# Patient Record
Sex: Female | Born: 1965 | Race: White | Hispanic: No | Marital: Single | Smoking: Never smoker
Health system: Southern US, Community
[De-identification: ages and names within clinical notes are randomized; demographics above are authoritative.]

## PROBLEM LIST (undated history)

## (undated) DIAGNOSIS — C801 Malignant (primary) neoplasm, unspecified: Secondary | ICD-10-CM

## (undated) DIAGNOSIS — F419 Anxiety disorder, unspecified: Secondary | ICD-10-CM

## (undated) DIAGNOSIS — R112 Nausea with vomiting, unspecified: Secondary | ICD-10-CM

## (undated) DIAGNOSIS — J309 Allergic rhinitis, unspecified: Secondary | ICD-10-CM

## (undated) DIAGNOSIS — T4145XA Adverse effect of unspecified anesthetic, initial encounter: Secondary | ICD-10-CM

## (undated) DIAGNOSIS — T8859XA Other complications of anesthesia, initial encounter: Secondary | ICD-10-CM

## (undated) DIAGNOSIS — Z9889 Other specified postprocedural states: Secondary | ICD-10-CM

## (undated) DIAGNOSIS — J329 Chronic sinusitis, unspecified: Secondary | ICD-10-CM

## (undated) HISTORY — PX: ABDOMINAL HYSTERECTOMY: SHX81

## (undated) HISTORY — PX: OTHER SURGICAL HISTORY: SHX169

---
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
## 2013-05-01 HISTORY — PX: OTHER SURGICAL HISTORY: SHX169

## 2017-12-21 ENCOUNTER — Other Ambulatory Visit: Payer: Self-pay | Admitting: Physician Assistant

## 2017-12-21 DIAGNOSIS — Z1231 Encounter for screening mammogram for malignant neoplasm of breast: Secondary | ICD-10-CM

## 2018-01-16 ENCOUNTER — Ambulatory Visit
Admission: RE | Admit: 2018-01-16 | Discharge: 2018-01-16 | Disposition: A | Discharge status: Home / Self Care | Payer: BC Managed Care – PPO | Source: Ambulatory Visit | Attending: Physician Assistant | Admitting: Physician Assistant

## 2018-01-16 DIAGNOSIS — Z1231 Encounter for screening mammogram for malignant neoplasm of breast: Secondary | ICD-10-CM

## 2019-01-01 ENCOUNTER — Other Ambulatory Visit: Payer: Self-pay | Admitting: Orthopaedic Surgery

## 2019-01-16 NOTE — Pre-Procedure Instructions (Signed)
[id number] - [place], [address] [place] [address] Phone: [phone] Fax: [phone]      Your procedure is scheduled on  01-21-19 from 0930-110AM  Report to [facility] Main Entrance "A" at 0730 A.M., and check in at the Admitting office.  Call this number if you have problems the morning of surgery:  [phone]  Call [phone] if you have any questions prior to your surgery date Monday-Friday 8am-4pm    Remember:  Do not eat  after midnight the night before your surgery  You may drink clear liquids until 0630AM the morning of your surgery.   Clear liquids allowed are: Water, Non-Citrus Juices (without pulp), Carbonated Beverages, Clear Tea, Black Coffee Only, and Gatorade  Please complete your PRE-SURGERY ENSURE that was provided to you by 0630AM the morning of surgery.  Please, if able, drink it in one setting. DO NOT SIP.   Take these medicines the morning of surgery with A SIP OF WATER : fluticasone (FLONASE) as needed ALPRAZolam Duanne Moron) as needed  escitalopram (LEXAPRO) rosuvastatin (CRESTOR)  7 days prior to surgery STOP taking any Aspirin (unless otherwise instructed by your surgeon), Aleve, Naproxen, Ibuprofen, Motrin, Advil, Goody's, BC's, all herbal medications, fish oil, and all vitamins.    The Morning of Surgery  Do not wear jewelry, make-up or nail polish.  Do not wear lotions, powders, or perfumes/colognes, or deodorant  Do not shave 48 hours prior to surgery.    Do not bring valuables to the hospital.  [facility] is not responsible for any belongings or valuables.  If you are a smoker, DO NOT Smoke 24 hours prior to surgery IF you wear a CPAP at night please bring your mask, tubing, and machine the morning of surgery   Remember that you must have someone to transport you home after your surgery, and remain with you for 24 hours if you are discharged the same day.   Contacts, glasses, hearing aids,  dentures or bridgework may not be worn into surgery.    Leave your suitcase in the car.  After surgery it may be brought to your room.  For patients admitted to the hospital, discharge time will be determined by your treatment team.  Patients discharged the day of surgery will not be allowed to drive home.    Special instructions:   [place]- Preparing For Surgery  Before surgery, you can play an important role. Because skin is not sterile, your skin needs to be as free of germs as possible. You can reduce the number of germs on your skin by washing with CHG (chlorahexidine gluconate) Soap before surgery.  CHG is an antiseptic cleaner which kills germs and bonds with the skin to continue killing germs even after washing.    Oral Hygiene is also important to reduce your risk of infection.  Remember - BRUSH YOUR TEETH THE MORNING OF SURGERY WITH YOUR REGULAR TOOTHPASTE  Please do not use if you have an allergy to CHG or antibacterial soaps. If your skin becomes reddened/irritated stop using the CHG.  Do not shave (including legs and underarms) for at least 48 hours prior to first CHG shower. It is OK to shave your face.  Please follow these instructions carefully.   1. Shower the NIGHT BEFORE SURGERY and the MORNING OF SURGERY with CHG Soap.   2. If you chose to wash your hair, wash your hair first as usual with your normal shampoo.  3. After you  shampoo, rinse your hair and body thoroughly to remove the shampoo.  4. Use CHG as you would any other liquid soap. You can apply CHG directly to the skin and wash gently with a scrungie or a clean washcloth.   5. Apply the CHG Soap to your body ONLY FROM THE NECK DOWN.  Do not use on open wounds or open sores. Avoid contact with your eyes, ears, mouth and genitals (private parts). Wash Face and genitals (private parts)  with your normal soap.   6. Wash thoroughly, paying special attention to the area where your surgery will be  performed.  7. Thoroughly rinse your body with warm water from the neck down.  8. DO NOT shower/wash with your normal soap after using and rinsing off the CHG Soap.  9. Pat yourself dry with a CLEAN TOWEL.  10. Wear CLEAN PAJAMAS to bed the night before surgery, wear comfortable clothes the morning of surgery  11. Place CLEAN SHEETS on your bed the night of your first shower and DO NOT SLEEP WITH PETS.    Day of Surgery:  Do not apply any deodorants/lotions. Please shower the morning of surgery with the CHG soap  Please wear clean clothes to the hospital/surgery center.   Remember to brush your teeth WITH YOUR REGULAR TOOTHPASTE.   Please read over the  fact sheets that you were given.

## 2019-01-17 ENCOUNTER — Encounter (HOSPITAL_COMMUNITY)
Admission: RE | Admit: 2019-01-17 | Discharge: 2019-01-17 | Disposition: A | Discharge status: Home / Self Care | Payer: BC Managed Care – PPO | Source: Ambulatory Visit | Attending: Orthopaedic Surgery | Admitting: Orthopaedic Surgery

## 2019-01-17 ENCOUNTER — Other Ambulatory Visit (HOSPITAL_COMMUNITY)
Admission: RE | Admit: 2019-01-17 | Discharge: 2019-01-17 | Disposition: A | Discharge status: Home / Self Care | Payer: BC Managed Care – PPO | Source: Ambulatory Visit | Attending: Orthopaedic Surgery | Admitting: Orthopaedic Surgery

## 2019-01-17 ENCOUNTER — Other Ambulatory Visit: Payer: Self-pay

## 2019-01-17 ENCOUNTER — Encounter (HOSPITAL_COMMUNITY): Payer: Self-pay

## 2019-01-17 DIAGNOSIS — Z01812 Encounter for preprocedural laboratory examination: Secondary | ICD-10-CM | POA: Diagnosis present

## 2019-01-17 HISTORY — DX: Chronic sinusitis, unspecified: J32.9

## 2019-01-17 HISTORY — DX: Allergic rhinitis, unspecified: J30.9

## 2019-01-17 HISTORY — DX: Other specified postprocedural states: R11.2

## 2019-01-17 HISTORY — DX: Anxiety disorder, unspecified: F41.9

## 2019-01-17 HISTORY — DX: Other complications of anesthesia, initial encounter: T88.59XA

## 2019-01-17 HISTORY — DX: Other specified postprocedural states: Z98.890

## 2019-01-17 HISTORY — DX: Malignant (primary) neoplasm, unspecified: C80.1

## 2019-01-17 LAB — CBC
HCT: 42.9 % (ref 36.0–46.0)
Hemoglobin: 14 g/dL (ref 12.0–15.0)
MCH: 29.9 pg (ref 26.0–34.0)
MCHC: 32.6 g/dL (ref 30.0–36.0)
MCV: 91.5 fL (ref 80.0–100.0)
Platelets: 230 10*3/uL (ref 150–400)
RBC: 4.69 MIL/uL (ref 3.87–5.11)
RDW: 13.1 % (ref 11.5–15.5)
WBC: 4.3 10*3/uL (ref 4.0–10.5)
nRBC: 0 % (ref 0.0–0.2)

## 2019-01-17 NOTE — Progress Notes (Signed)
PCP: Julian Hy, PA--[facility] Cardiologist: Denies  EKG: n/a CXR: n/a ECHO: denies Stress Test: denies Cardiac Cath: denies  Pre-surgery Ensure drink provided  Patient denies shortness of breath, fever, cough, and chest pain at PAT appointment.  Patient verbalized understanding of instructions provided today at the PAT appointment.  Patient asked to review instructions at home and day of surgery.

## 2019-01-17 NOTE — Pre-Procedure Instructions (Signed)
[id number] - [place], [place] [address] Phone: [phone] Fax: [phone]      Your procedure is scheduled on  January 21, 2019  Report to [facility] Main Entrance "A" at 07:30 A.M., and check in at the Admitting office.  Call this number if you have problems the morning of surgery:  [phone]  Call [phone] if you have any questions prior to your surgery date Monday-Friday 8am-4pm   Remember:  Do not eat  after midnight the night before your surgery  You may drink clear liquids until 06:30AM the morning of your surgery.   Clear liquids allowed are: Water, Non-Citrus Juices (without pulp), Carbonated Beverages, Clear Tea, Black Coffee Only, and Gatorade  Please complete your PRE-SURGERY ENSURE that was provided to you by 06:30AM the morning of surgery.  Please, if able, drink it in one setting. DO NOT SIP.   Take these medicines the morning of surgery with A SIP OF WATER : fluticasone (FLONASE) as needed ALPRAZolam Duanne Moron) as needed Eye drops if needed Sodium Chloride (Ocean) nasal spray if needed  escitalopram (LEXAPRO) rosuvastatin (CRESTOR)  7 days prior to surgery STOP taking any Aspirin (unless otherwise instructed by your surgeon), Aleve, Naproxen, Ibuprofen, Motrin, Advil, Goody's, BC's, all herbal medications, fish oil, and all vitamins.    The Morning of Surgery  Do not wear jewelry, make-up or nail polish.  Do not wear lotions, powders, or perfumes/colognes, or deodorant  Do not shave 48 hours prior to surgery.    Do not bring valuables to the hospital.  [facility] is not responsible for any belongings or valuables.  If you are a smoker, DO NOT Smoke 24 hours prior to surgery IF you wear a CPAP at night please bring your mask, tubing, and machine the morning of surgery   Remember that you must have someone to transport you home after your surgery, andremain with you for 24 hours if you are  discharged the same day.  Contacts, glasses, hearing aids, dentures or bridgework may not be worn into surgery.   Leave your suitcase in the car.  After surgery it may be brought to your room.  For patients admitted to the hospital, discharge time will be determined by your treatment team.  Patients discharged the day of surgery will not be allowed to drive home.    Special instructions:   [place]- Preparing For Surgery  Before surgery, you can play an important role. Because skin is not sterile, your skin needs to be as free of germs as possible. You can reduce the number of germs on your skin by washing with CHG (chlorahexidine gluconate) Soap before surgery.  CHG is an antiseptic cleaner which kills germs and bonds with the skin to continue killing germs even after washing.    Oral Hygiene is also important to reduce your risk of infection.  Remember - BRUSH YOUR TEETH THE MORNING OF SURGERY WITH YOUR REGULAR TOOTHPASTE  Please do not use if you have an allergy to CHG or antibacterial soaps. If your skin becomes reddened/irritated stop using the CHG.  Do not shave (including legs and underarms) for at least 48 hours prior to first CHG shower. It is OK to shave your face.  Please follow these instructions carefully.   1. Shower the NIGHT BEFORE SURGERY and the MORNING OF SURGERY with CHG Soap.   2. If you chose to wash your hair, wash your hair first as usual with  your normal shampoo.  3. After you shampoo, rinse your hair and body thoroughly to remove the shampoo.  4. Use CHG as you would any other liquid soap. You can apply CHG directly to the skin and wash gently with a scrungie or a clean washcloth.   5. Apply the CHG Soap to your body ONLY FROM THE NECK DOWN.  Do not use on open wounds or open sores. Avoid contact with your eyes, ears, mouth and genitals (private parts). Wash Face and genitals (private parts)  with your normal soap.   6. Wash thoroughly, paying special  attention to the area where your surgery will be performed.  7. Thoroughly rinse your body with warm water from the neck down.  8. DO NOT shower/wash with your normal soap after using and rinsing off the CHG Soap.  9. Pat yourself dry with a CLEAN TOWEL.  10. Wear CLEAN PAJAMAS to bed the night before surgery, wear comfortable clothes the morning of surgery  11. Place CLEAN SHEETS on your bed the night of your first shower and DO NOT SLEEP WITH PETS.    Day of Surgery:  Do not apply any deodorants/lotions. Please shower the morning of surgery with the CHG soap  Please wear clean clothes to the hospital/surgery center.   Remember to brush your teeth WITH YOUR REGULAR TOOTHPASTE.   Please read over the  fact sheets that you were given.

## 2019-01-18 LAB — NOVEL CORONAVIRUS, NAA (HOSP ORDER, SEND-OUT TO REF LAB; TAT 18-24 HRS): SARS-CoV-2, NAA: NOT DETECTED

## 2019-01-20 NOTE — Anesthesia Preprocedure Evaluation (Addendum)
Anesthesia Evaluation  Patient identified by MRN, date of birth, ID band Patient awake    Reviewed: Allergy & Precautions, NPO status , Patient's Chart, lab work & pertinent test results  History of Anesthesia Complications (+) PONV and history of anesthetic complications  Airway Mallampati: II  TM Distance: >3 FB Neck ROM: Full    Dental no notable dental hx.    Pulmonary neg pulmonary ROS,    Pulmonary exam normal        Cardiovascular negative cardio ROS Normal cardiovascular exam     Neuro/Psych Anxiety negative neurological ROS  negative psych ROS   GI/Hepatic negative GI ROS, Neg liver ROS,   Endo/Other  negative endocrine ROS  Renal/GU negative Renal ROS  negative genitourinary   Musculoskeletal negative musculoskeletal ROS (+)   Abdominal   Peds  Hematology negative hematology ROS (+)   Anesthesia Other Findings Day of surgery medications reviewed with patient.  Reproductive/Obstetrics negative OB ROS                            Anesthesia Physical Anesthesia Plan  ASA: II  Anesthesia Plan: Regional   Post-op Pain Management:    Induction:   PONV Risk Score and Plan: 3 and Treatment may vary due to age or medical condition, Propofol infusion, Ondansetron and Midazolam  Airway Management Planned: Natural Airway and Simple Face Mask  Additional Equipment: None  Intra-op Plan:   Post-operative Plan:   Informed Consent: I have reviewed the patients History and Physical, chart, labs and discussed the procedure including the risks, benefits and alternatives for the proposed anesthesia with the patient or authorized representative who has indicated his/her understanding and acceptance.     Dental advisory given  Plan Discussed with: CRNA  Anesthesia Plan Comments:        Anesthesia Quick Evaluation

## 2019-01-21 ENCOUNTER — Ambulatory Visit (HOSPITAL_COMMUNITY): Payer: BC Managed Care – PPO | Admitting: Anesthesiology

## 2019-01-21 ENCOUNTER — Encounter (HOSPITAL_COMMUNITY)
Admission: RE | Disposition: A | Discharge status: Home / Self Care | Payer: Self-pay | Source: Home / Self Care | Attending: Orthopaedic Surgery

## 2019-01-21 ENCOUNTER — Ambulatory Visit (HOSPITAL_COMMUNITY)
Admission: RE | Admit: 2019-01-21 | Discharge: 2019-01-21 | Disposition: A | Discharge status: Home / Self Care | Payer: BC Managed Care – PPO | Attending: Orthopaedic Surgery | Admitting: Orthopaedic Surgery

## 2019-01-21 ENCOUNTER — Encounter (HOSPITAL_COMMUNITY): Payer: Self-pay

## 2019-01-21 ENCOUNTER — Other Ambulatory Visit: Payer: Self-pay

## 2019-01-21 DIAGNOSIS — J309 Allergic rhinitis, unspecified: Secondary | ICD-10-CM | POA: Insufficient documentation

## 2019-01-21 DIAGNOSIS — Z85828 Personal history of other malignant neoplasm of skin: Secondary | ICD-10-CM | POA: Insufficient documentation

## 2019-01-21 DIAGNOSIS — F419 Anxiety disorder, unspecified: Secondary | ICD-10-CM | POA: Diagnosis not present

## 2019-01-21 DIAGNOSIS — L7612 Accidental puncture and laceration of skin and subcutaneous tissue during other procedure: Secondary | ICD-10-CM | POA: Insufficient documentation

## 2019-01-21 DIAGNOSIS — Z79890 Hormone replacement therapy (postmenopausal): Secondary | ICD-10-CM | POA: Insufficient documentation

## 2019-01-21 DIAGNOSIS — Y658 Other specified misadventures during surgical and medical care: Secondary | ICD-10-CM | POA: Diagnosis not present

## 2019-01-21 DIAGNOSIS — Z79899 Other long term (current) drug therapy: Secondary | ICD-10-CM | POA: Insufficient documentation

## 2019-01-21 DIAGNOSIS — M720 Palmar fascial fibromatosis [Dupuytren]: Secondary | ICD-10-CM | POA: Diagnosis not present

## 2019-01-21 DIAGNOSIS — R2242 Localized swelling, mass and lump, left lower limb: Secondary | ICD-10-CM | POA: Diagnosis present

## 2019-01-21 HISTORY — PX: MASS EXCISION: SHX2000

## 2019-01-21 SURGERY — EXCISION MASS
Anesthesia: Regional | Site: Foot | Laterality: Left

## 2019-01-21 MED ORDER — PROMETHAZINE HCL 25 MG/ML IJ SOLN: 6.2500 mg | INTRAMUSCULAR | Status: DC | PRN

## 2019-01-21 MED ORDER — FENTANYL CITRATE (PF) 100 MCG/2ML IJ SOLN: 25.0000 ug | INTRAMUSCULAR | Status: DC | PRN

## 2019-01-21 MED ORDER — FENTANYL CITRATE (PF) 100 MCG/2ML IJ SOLN
INTRAMUSCULAR | Status: AC
  Administered 2019-01-21: 100 ug via INTRAVENOUS
  Filled 2019-01-21: qty 2

## 2019-01-21 MED ORDER — MIDAZOLAM HCL 2 MG/2ML IJ SOLN
1.0000 mg | Freq: Once | INTRAMUSCULAR | Status: AC
  Administered 2019-01-21: 09:00:00 1 mg via INTRAVENOUS

## 2019-01-21 MED ORDER — OXYCODONE HCL 5 MG PO TABS: 5.0000 mg | ORAL_TABLET | Freq: Once | ORAL | Status: DC | PRN

## 2019-01-21 MED ORDER — ACETAMINOPHEN 500 MG PO TABS
1000.0000 mg | ORAL_TABLET | Freq: Once | ORAL | Status: AC
  Administered 2019-01-21: 1000 mg via ORAL
  Filled 2019-01-21: qty 2

## 2019-01-21 MED ORDER — LACTATED RINGERS IV SOLN
INTRAVENOUS | Status: DC
  Administered 2019-01-21: 08:00:00 via INTRAVENOUS

## 2019-01-21 MED ORDER — POVIDONE-IODINE 10 % EX SWAB
2.0000 "application " | Freq: Once | CUTANEOUS | Status: AC
  Administered 2019-01-21: 2 via TOPICAL

## 2019-01-21 MED ORDER — OXYCODONE HCL 5 MG/5ML PO SOLN: 5.0000 mg | Freq: Once | ORAL | Status: DC | PRN

## 2019-01-21 MED ORDER — MIDAZOLAM HCL 2 MG/2ML IJ SOLN
INTRAMUSCULAR | Status: AC
  Administered 2019-01-21: 09:00:00 1 mg via INTRAVENOUS
  Filled 2019-01-21: qty 2

## 2019-01-21 MED ORDER — BUPIVACAINE HCL (PF) 0.5 % IJ SOLN
INTRAMUSCULAR | Status: DC | PRN
  Administered 2019-01-21: 30 mL via PERINEURAL

## 2019-01-21 MED ORDER — CLINDAMYCIN PHOSPHATE 900 MG/50ML IV SOLN
900.0000 mg | Freq: Once | INTRAVENOUS | Status: AC
  Administered 2019-01-21: 900 mg via INTRAVENOUS
  Filled 2019-01-21: qty 50

## 2019-01-21 MED ORDER — PROPOFOL 10 MG/ML IV BOLUS
INTRAVENOUS | Status: DC | PRN
  Administered 2019-01-21: 20 mg via INTRAVENOUS

## 2019-01-21 MED ORDER — ONDANSETRON HCL 4 MG/2ML IJ SOLN
INTRAMUSCULAR | Status: DC | PRN
  Administered 2019-01-21: 4 mg via INTRAVENOUS

## 2019-01-21 MED ORDER — PROPOFOL 500 MG/50ML IV EMUL
INTRAVENOUS | Status: DC | PRN
  Administered 2019-01-21: 75 ug/kg/min via INTRAVENOUS

## 2019-01-21 MED ORDER — OXYCODONE HCL 5 MG PO TABS: 5.0000 mg | ORAL_TABLET | ORAL | 0 refills | Status: AC | PRN

## 2019-01-21 MED ORDER — 0.9 % SODIUM CHLORIDE (POUR BTL) OPTIME
TOPICAL | Status: DC | PRN
  Administered 2019-01-21: 1000 mL

## 2019-01-21 MED ORDER — LIDOCAINE 2% (20 MG/ML) 5 ML SYRINGE
INTRAMUSCULAR | Status: DC | PRN
  Administered 2019-01-21: 40 mg via INTRAVENOUS

## 2019-01-21 MED ORDER — ONDANSETRON HCL 4 MG/2ML IJ SOLN
INTRAMUSCULAR | Status: AC
  Filled 2019-01-21: qty 2

## 2019-01-21 MED ORDER — LIDOCAINE 2% (20 MG/ML) 5 ML SYRINGE
INTRAMUSCULAR | Status: AC
  Filled 2019-01-21: qty 5

## 2019-01-21 MED ORDER — FENTANYL CITRATE (PF) 100 MCG/2ML IJ SOLN
100.0000 ug | Freq: Once | INTRAMUSCULAR | Status: AC
  Administered 2019-01-21: 09:00:00 100 ug via INTRAVENOUS

## 2019-01-21 SURGICAL SUPPLY — 42 items
BNDG COHESIVE 4X5 TAN STRL (GAUZE/BANDAGES/DRESSINGS) IMPLANT
BNDG ELASTIC 4X5.8 VLCR STR LF (GAUZE/BANDAGES/DRESSINGS) ×2 IMPLANT
BNDG ELASTIC 6X10 VLCR STRL LF (GAUZE/BANDAGES/DRESSINGS) ×2 IMPLANT
BNDG ESMARK 4X9 LF (GAUZE/BANDAGES/DRESSINGS) ×2 IMPLANT
BNDG GAUZE ELAST 4 BULKY (GAUZE/BANDAGES/DRESSINGS) ×2 IMPLANT
CHLORAPREP W/TINT 26 (MISCELLANEOUS) ×4 IMPLANT
COVER SURGICAL LIGHT HANDLE (MISCELLANEOUS) ×4 IMPLANT
COVER WAND RF STERILE (DRAPES) IMPLANT
DRAPE U-SHAPE 47X51 STRL (DRAPES) ×2 IMPLANT
DRSG PAD ABDOMINAL 8X10 ST (GAUZE/BANDAGES/DRESSINGS) ×2 IMPLANT
DRSG XEROFORM 1X8 (GAUZE/BANDAGES/DRESSINGS) ×2 IMPLANT
ELECT REM PT RETURN 9FT ADLT (ELECTROSURGICAL) ×2
ELECTRODE REM PT RTRN 9FT ADLT (ELECTROSURGICAL) ×1 IMPLANT
GAUZE SPONGE 4X4 12PLY STRL (GAUZE/BANDAGES/DRESSINGS) ×2 IMPLANT
GAUZE SPONGE 4X4 12PLY STRL LF (GAUZE/BANDAGES/DRESSINGS) ×2 IMPLANT
GAUZE XEROFORM 1X8 LF (GAUZE/BANDAGES/DRESSINGS) ×2 IMPLANT
GLOVE BIOGEL M STRL SZ7.5 (GLOVE) ×2 IMPLANT
GLOVE INDICATOR 8.0 STRL GRN (GLOVE) ×2 IMPLANT
GOWN STRL REUS W/ TWL LRG LVL3 (GOWN DISPOSABLE) ×1 IMPLANT
GOWN STRL REUS W/ TWL XL LVL3 (GOWN DISPOSABLE) ×1 IMPLANT
GOWN STRL REUS W/TWL LRG LVL3 (GOWN DISPOSABLE) ×1
GOWN STRL REUS W/TWL XL LVL3 (GOWN DISPOSABLE) ×1
HANDPIECE INTERPULSE COAX TIP (DISPOSABLE) ×1
KIT BASIN OR (CUSTOM PROCEDURE TRAY) ×2 IMPLANT
KIT TURNOVER KIT B (KITS) ×2 IMPLANT
MANIFOLD NEPTUNE II (INSTRUMENTS) ×2 IMPLANT
NEEDLE 22X1 1/2 (OR ONLY) (NEEDLE) IMPLANT
NS IRRIG 1000ML POUR BTL (IV SOLUTION) ×2 IMPLANT
PACK ORTHO EXTREMITY (CUSTOM PROCEDURE TRAY) ×2 IMPLANT
PAD ARMBOARD 7.5X6 YLW CONV (MISCELLANEOUS) ×4 IMPLANT
PAD CAST 4YDX4 CTTN HI CHSV (CAST SUPPLIES) ×1 IMPLANT
PADDING CAST COTTON 4X4 STRL (CAST SUPPLIES) ×1
SET HNDPC FAN SPRY TIP SCT (DISPOSABLE) ×1 IMPLANT
SPONGE LAP 18X18 RF (DISPOSABLE) IMPLANT
STOCKINETTE IMPERVIOUS 9X36 MD (GAUZE/BANDAGES/DRESSINGS) IMPLANT
SUT ETHILON 2 0 FS 18 (SUTURE) IMPLANT
SUT ETHILON 3 0 PS 1 (SUTURE) ×2 IMPLANT
SUT MON AB 3-0 SH 27 (SUTURE) ×1
SUT MON AB 3-0 SH27 (SUTURE) ×1 IMPLANT
TUBE CONNECTING 12X1/4 (SUCTIONS) ×2 IMPLANT
UNDERPAD 30X30 (UNDERPADS AND DIAPERS) ×2 IMPLANT
YANKAUER SUCT BULB TIP NO VENT (SUCTIONS) ×2 IMPLANT

## 2019-01-21 NOTE — Op Note (Signed)
Diane Dillon female 53 y.o. 01/21/2019  PreOperative Diagnosis: Left foot plantar soft tissue mass  PostOperative Diagnosis: Same  PROCEDURE: Excision of left foot plantar soft tissue mass, deep  SURGEON: Melony Overly, MD  ASSISTANT: Holden Nevin, RNFA  ANESTHESIA: MAC with ankle block  FINDINGS: Plantar soft tissue mass adherent to the subcuticular tissue and within the plantar fascia  IMPLANTS: None  INDICATIONS:52 y.o. female had years of pain due to a plantar soft tissue mass.  She has a family history of Dupuytren's but does not have any hand deformities.  She has bilateral plantar soft tissue masses the left more symptomatic than the right.  She was seen in the office and failed conservative treatment the form of shoe modifications, activity modifications, orthotics and pain medication.  Given this she was indicated for excision of the soft tissue mass.  We discussed that the mass may return.  We also discussed the possibility of wound healing complications, infection, and demonstrating structures.  She wished to proceed with surgery after weighing these risk.  We also discussed the anesthetic and perioperative risk which include death.  Postoperatively she will allow to be weightbearing on the heel.  PROCEDURE: Patient was identified the preoperative holding area.  The left foot was marked by myself.  Consent was signed myself and the patient.  Anesthesia performed an ankle block.  She was taken the operative suite placed supine the operative table.  MAC anesthesia was induced without difficulty.  Bone foam was used on the left foot and perioperative antibiotics were given.  Left lower extremity was prepped and draped in the usual sterile fashion and a surgical timeout was performed.  A 4 inch Esmarch tourniquet was placed at the ankle.  We began by making a longitudinal incision on the medial plantar foot above the level of the glabrous skin.  This was taken sharply down through  skin and subcutaneous tissue.  The abductor hallucis longus tendon and muscle belly was identified and the fascia overlying this was incised.  Then an interval between the plantar aspect of the abductor pollicis muscle and the plantar soft tissue was created.  Then the medial cord of the plantar fascia was identified.  The plantar medial nerve was identified and protected.  Then a dorsal and plantar interval between the soft tissue mass that was within the plantar fascia was created.  The mass was excised sharply using a 15 blade, tenotomy scissors and blunt dissection.  The mass was quite large.  It was passed off and sent to pathology for evaluation.  During the mass excision process the mass was densely adhered to the plantar skin and a small 3 mm buttonhole through the plantar foot skin was created during excision.  This was unintentional.  After the mass was removed the deep area was inspected and palpated for further masses which there were none.  The wound was irrigated with normal saline.  The tourniquet was released.  Hemostasis was obtained through direct compression and Bovie cautery.  Then the deep tissue was closed with a 3-0 Monocryl stitch.  The skin was closed with 3-0 nylon.  The small buttonhole in the bottom of the foot was closed with 1 stitch of 2-0 nylon.  Then soft dressing in the form of Xeroform, 4 x 4's, sterile sheet cotton and Ace wrap was placed.  She was awakened from anesthesia and taken to recovery in stable condition.  POST OPERATIVE INSTRUCTIONS: Heel weightbearing to the left lower extremity Keep dressing in place  until follow-up Patient will follow-up in 2 weeks for dressing removal, wound check and suture removal if appropriate and transition to regular shoes once pain and swelling allows. We will follow-up on pathology  BLOOD LOSS:  Minimal         DRAINS: none         SPECIMEN: none       COMPLICATIONS: Small inadvertent buttonhole through the plantar skin  during mass excision which was closed uneventfully with a 3-0 nylon stitch         Disposition: PACU - hemodynamically stable.         Condition: stable

## 2019-01-21 NOTE — H&P (Signed)
Diane Dillon is an 53 y.o. female.   Chief Complaint: Left foot plantar soft tissue mass HPI: Diane Dillon is here today for surgical resection of her left foot mass.  She has had symptomatic left plantar foot mass for several years.  She is tried conservative treatment the form of shoe modifications, orthotics, activity modifications but continues have discomfort.  She denies that the masses grown significantly over the past several months.  She has a mass on the contralateral foot which is less symptomatic.  There is a family history of Dupuytren's disease within her relatives.  She denies any recent fevers or chills.  Denies any other lumps or bumps.  Past Medical History:  Diagnosis Date  . Allergic rhinitis   . Anxiety   . Cancer (HCC)    basal cell, forehead  . Chronic sinusitis   . Complication of anesthesia   . PONV (postoperative nausea and vomiting)     Past Surgical History:  Procedure Laterality Date  . ABDOMINAL HYSTERECTOMY    . CESAREAN SECTION    . mohs procedure     forehead  . SMR turbinates, FESS with maxillary balloon sinuplasty Bilateral 2015    Family History  Problem Relation Age of Onset  . Breast cancer Mother 42   Social History:  reports that she has never smoked. She has never used smokeless tobacco. She reports current alcohol use of about 1.0 standard drinks of alcohol per week. She reports that she does not use drugs.  Allergies:  Allergies  Allergen Reactions  . Cefaclor Hives  . Neosporin [Bacitracin-Polymyxin B] Itching    Blisters  . Sulfamethoxazole-Trimethoprim Rash    Medications Prior to Admission  Medication Sig Dispense Refill  . ALPRAZolam (XANAX) 0.25 MG tablet Take 0.25 mg by mouth daily as needed for anxiety.    Marland Kitchen amphetamine-dextroamphetamine (ADDERALL XR) 10 MG 24 hr capsule Take 10 mg by mouth daily.    . Ascorbic Acid (VITAMIN C) 1000 MG tablet Take 1,000 mg by mouth daily.    . Cholecalciferol (VITAMIN D3) 250 MCG (10000 UT)  capsule Take 10,000 Units by mouth daily.    . Cyanocobalamin (B-12) 5000 MCG CAPS Take 5,000 mcg by mouth daily.    Marland Kitchen escitalopram (LEXAPRO) 10 MG tablet Take 10 mg by mouth daily.    Marland Kitchen estradiol (ESTRACE) 1 MG tablet Take 1 mg by mouth daily.    . fluticasone (FLONASE) 50 MCG/ACT nasal spray Place 2 sprays into both nostrils daily as needed for allergies or rhinitis.    Marland Kitchen ibuprofen (ADVIL) 200 MG tablet Take 400-800 mg by mouth every 6 (six) hours as needed.    Vladimir Faster Glycol-Propyl Glycol (SYSTANE OP) Place 1 drop into both eyes 2 (two) times daily as needed (dry eyes).    . rosuvastatin (CRESTOR) 10 MG tablet Take 10 mg by mouth daily.    . sodium chloride (OCEAN) 0.65 % SOLN nasal spray Place 1 spray into both nostrils as needed for congestion.      No results found for this or any previous visit (from the past 48 hour(s)). No results found.  Review of Systems  Constitutional: Negative.   HENT: Negative.   Eyes: Negative.   Respiratory: Negative.   Cardiovascular: Negative.   Gastrointestinal: Negative.   Musculoskeletal:       Left foot plantar mass  Skin: Negative.   Neurological: Negative.   Psychiatric/Behavioral: Negative.     Blood pressure 113/70, pulse 74, temperature 97.7 F (36.5 C),  temperature source Oral, resp. rate 13, height 5\' 9"  (1.753 m), weight 96.4 kg, SpO2 99 %. Physical Exam  Constitutional: She appears well-developed.  HENT:  Head: Normocephalic.  Eyes: Conjunctivae are normal.  Neck: Neck supple.  Cardiovascular: Normal rate.  Respiratory: Effort normal.  GI: Soft.  Musculoskeletal:     Comments: Left foot demonstrates a tender to palpation about the plantar mass on the plantar medial aspect of the left foot.  It is within the medial band of the plantar fascia it seems like.  Tender to palpation.  She has normal sensation plantarly about the foot and dorsally right foot.  Palpable dorsalis pedis pulse.  Able to actively dorsiflex plantarflex  ankle.  Neurological: She is alert.  Skin: Skin is warm.  Psychiatric: She has a normal mood and affect.     Assessment/Plan We will proceed with resection of her left foot soft tissue mass.  We discussed the risk, benefits alternatives of surgery which include but not limited to wound healing complications, infection, need for further surgery and return or recurrence of the mass, damage to  Surrounding structures as well as perioperative anesthetic risk which include death.  After weighing his risk he wished to proceed with surgery.  Erle Crocker, MD 01/21/2019, 9:27 AM

## 2019-01-21 NOTE — Anesthesia Procedure Notes (Signed)
Procedure Name: MAC Date/Time: 01/21/2019 9:35 AM Performed by: Moshe Salisbury, CRNA Pre-anesthesia Checklist: Patient identified, Emergency Drugs available, Suction available, Patient being monitored and Timeout performed Patient Re-evaluated:Patient Re-evaluated prior to induction Oxygen Delivery Method: Nasal cannula Placement Confirmation: positive ETCO2 Dental Injury: Teeth and Oropharynx as per pre-operative assessment

## 2019-01-21 NOTE — Transfer of Care (Signed)
Immediate Anesthesia Transfer of Care Note  Patient: Diane Dillon  Procedure(s) Performed: EXCISION OF LEFT FOOT SOFT TISSUE MASS (Left Foot)  Patient Location: PACU  Anesthesia Type:MAC combined with regional for post-op pain  Level of Consciousness: drowsy and patient cooperative  Airway & Oxygen Therapy: Patient Spontanous Breathing and Patient connected to nasal cannula oxygen  Post-op Assessment: Report given to RN and Post -op Vital signs reviewed and stable  Post vital signs: Reviewed and stable  Last Vitals:  Vitals Value Taken Time  BP 93/66 01/21/19 1030  Temp    Pulse 68 01/21/19 1030  Resp 11 01/21/19 1030  SpO2 98 % 01/21/19 1030  Vitals shown include unvalidated device data.  Last Pain:  Vitals:   01/21/19 0915  TempSrc:   PainSc: 0-No pain         Complications: No apparent anesthesia complications

## 2019-01-21 NOTE — Discharge Instructions (Signed)
DR. Lucia Gaskins FOOT & ANKLE SURGERY POST-OP INSTRUCTIONS   Pain Management 1. The numbing medicine and your leg will last around 8 hours, take a dose of your pain medicine as soon as you feel it wearing off to avoid rebound pain. 2. Keep your foot elevated above heart level.  Make sure that your heel hangs free ('floats'). 3. Take all prescribed medication as directed. 4. If taking narcotic pain medication you may want to use an over-the-counter stool softener to avoid constipation. 5. You may take over-the-counter NSAIDs (ibuprofen, naproxen, etc.) as well as over-the-counter acetaminophen as directed on the packaging as a supplement for your pain and may also use it to wean away from the prescription medication.  Activity ? Heel weightbearing  First Postoperative Visit 1. Your first postop visit will be at least 2 weeks after surgery.  This should be scheduled when you schedule surgery. 2. If you do not have a postoperative visit scheduled please call [phone] to schedule an appointment. 3. At the appointment your incision will be evaluated for suture removal, x-rays will be obtained if necessary.  General Instructions 1. Swelling is very common after foot and ankle surgery.  It often takes 3 months for the foot and ankle to begin to feel comfortable.  Some amount of swelling will persist for 6-12 months. 2. DO NOT change the dressing.  If there is a problem with the dressing (too tight, loose, gets wet, etc.) please contact Dr. Pollie Friar office. 3. DO NOT get the dressing wet.  For showers you can use an over-the-counter cast cover or wrap a washcloth around the top of your dressing and then cover it with a plastic bag and tape it to your leg. 4. DO NOT soak the incision (no tubs, pools, bath, etc.) until you have approval from Dr. Lucia Gaskins.  Contact Dr. Huel Cote office or go to Emergency Room if: 1. Temperature above 101 F. 2. Increasing pain that is unresponsive to pain medication or  elevation 3. Excessive redness or swelling in your foot 4. Dressing problems - excessive bloody drainage, looseness or tightness, or if dressing gets wet 5. Develop pain, swelling, warmth, or discoloration of your calf

## 2019-01-21 NOTE — Anesthesia Procedure Notes (Signed)
Anesthesia Regional Block: Ankle block   Pre-Anesthetic Checklist: ,, timeout performed, Correct Patient, Correct Site, Correct Laterality, Correct Procedure, Correct Position, site marked, Risks and benefits discussed, pre-op evaluation,  At surgeon's request and post-op pain management  Laterality: Left  Prep: Maximum Sterile Barrier Precautions used, chloraprep       Needles:  Injection technique: Single-shot  Needle Type: Echogenic Needle     Needle Length: 4cm  Needle Gauge: 25     Additional Needles:   Narrative:  Start time: 01/21/2019 9:08 AM End time: 01/21/2019 9:10 AM  Performed by: Personally  Anesthesiologist: Brennan Bailey, MD  Additional Notes: Risks, benefits, and alternative discussed. Patient gave consent for procedure. Patient prepped and draped in sterile fashion. Sedation administered, patient remains easily responsive to voice. Local anesthetic given in 5cc increments with no signs or symptoms of intravascular injection. No pain or paraesthesias with injection. Patient monitored throughout procedure with signs of LAST or immediate complications. Tolerated well.   Tawny Asal, MD

## 2019-01-21 NOTE — Anesthesia Postprocedure Evaluation (Signed)
Anesthesia Post Note  Patient: Diane Dillon  Procedure(s) Performed: EXCISION OF LEFT FOOT SOFT TISSUE MASS (Left Foot)     Patient location during evaluation: PACU Anesthesia Type: Regional Level of consciousness: awake and alert and oriented Pain management: pain level controlled Vital Signs Assessment: post-procedure vital signs reviewed and stable Respiratory status: spontaneous breathing, nonlabored ventilation and respiratory function stable Cardiovascular status: blood pressure returned to baseline Postop Assessment: no apparent nausea or vomiting Anesthetic complications: no    Last Vitals:  Vitals:   01/21/19 1059 01/21/19 1119  BP: 104/70 110/76  Pulse: 61 63  Resp: 10 12  Temp:    SpO2: 100% 100%    Last Pain:  Vitals:   01/21/19 1119  TempSrc:   PainSc: 0-No pain                 Brennan Bailey

## 2019-01-22 ENCOUNTER — Encounter (HOSPITAL_COMMUNITY): Payer: Self-pay | Admitting: Orthopaedic Surgery

## 2019-01-23 LAB — SURGICAL PATHOLOGY

## 2019-09-10 ENCOUNTER — Other Ambulatory Visit: Payer: Self-pay | Admitting: Physician Assistant

## 2019-09-10 DIAGNOSIS — Z1231 Encounter for screening mammogram for malignant neoplasm of breast: Secondary | ICD-10-CM

## 2019-10-07 ENCOUNTER — Ambulatory Visit
Admission: RE | Admit: 2019-10-07 | Discharge: 2019-10-07 | Disposition: A | Discharge status: Home / Self Care | Payer: BC Managed Care – PPO | Source: Ambulatory Visit | Attending: Physician Assistant | Admitting: Physician Assistant

## 2019-10-07 ENCOUNTER — Other Ambulatory Visit: Payer: Self-pay

## 2019-10-07 DIAGNOSIS — Z1231 Encounter for screening mammogram for malignant neoplasm of breast: Secondary | ICD-10-CM

## 2021-02-04 ENCOUNTER — Other Ambulatory Visit: Payer: Self-pay | Admitting: Family Medicine

## 2021-02-04 DIAGNOSIS — Z1231 Encounter for screening mammogram for malignant neoplasm of breast: Secondary | ICD-10-CM

## 2021-02-24 ENCOUNTER — Other Ambulatory Visit: Payer: Self-pay

## 2021-02-24 ENCOUNTER — Ambulatory Visit
Admission: RE | Admit: 2021-02-24 | Discharge: 2021-02-24 | Disposition: A | Discharge status: Home / Self Care | Payer: BC Managed Care – PPO | Source: Ambulatory Visit | Attending: Family Medicine | Admitting: Family Medicine

## 2021-02-24 DIAGNOSIS — Z1231 Encounter for screening mammogram for malignant neoplasm of breast: Secondary | ICD-10-CM

## 2021-03-03 ENCOUNTER — Other Ambulatory Visit: Payer: Self-pay | Admitting: Obstetrics and Gynecology

## 2021-03-03 ENCOUNTER — Other Ambulatory Visit: Payer: Self-pay | Admitting: Specialist

## 2021-03-03 DIAGNOSIS — Z780 Asymptomatic menopausal state: Secondary | ICD-10-CM

## 2021-08-24 ENCOUNTER — Other Ambulatory Visit: Payer: BC Managed Care – PPO

## 2021-08-25 ENCOUNTER — Ambulatory Visit
Admission: RE | Admit: 2021-08-25 | Discharge: 2021-08-25 | Disposition: A | Discharge status: Home / Self Care | Payer: BC Managed Care – PPO | Source: Ambulatory Visit | Attending: Obstetrics and Gynecology | Admitting: Obstetrics and Gynecology

## 2021-08-25 DIAGNOSIS — Z780 Asymptomatic menopausal state: Secondary | ICD-10-CM

## 2022-07-31 IMAGING — MG MM DIGITAL SCREENING BILAT W/ TOMO AND CAD
8 series · 9 of 24 positions shown · non-contrast
Comparison: Previous exam(s).

CLINICAL DATA: Screening.

EXAM:
DIGITAL SCREENING BILATERAL MAMMOGRAM WITH TOMOSYNTHESIS AND CAD
TECHNIQUE: Bilateral screening digital craniocaudal and mediolateral oblique
mammograms were obtained. Bilateral screening digital breast
tomosynthesis was performed. The images were evaluated with
computer-aided detection.

[R CC synth-2D]
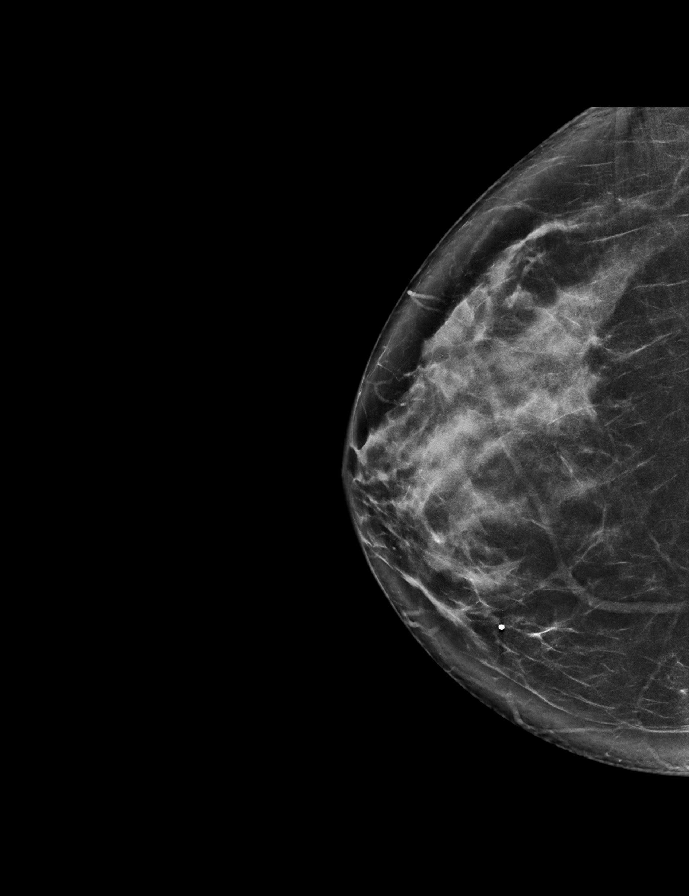

[L CC synth-2D]
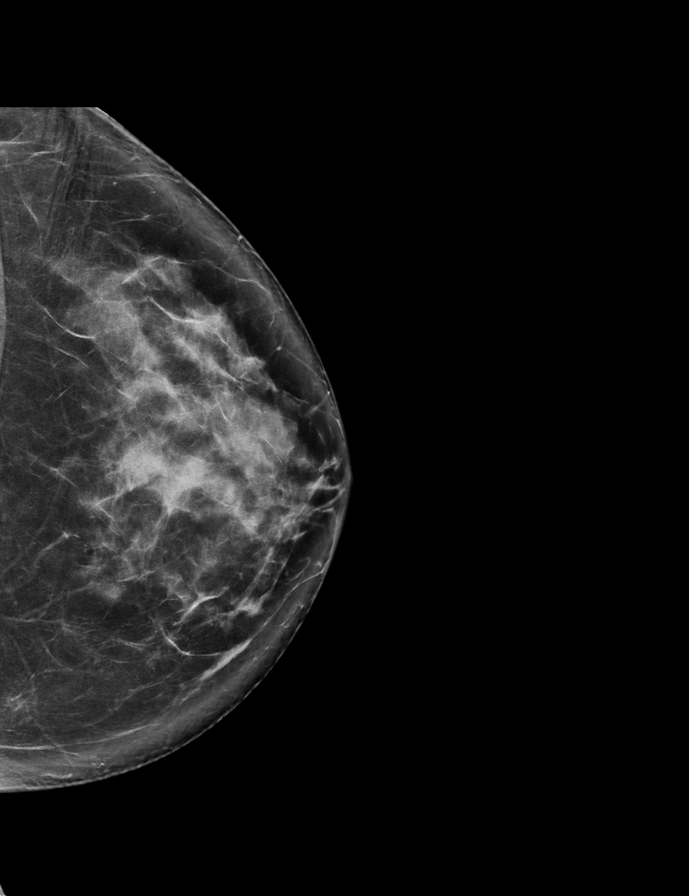

[L MLO synth-2D]
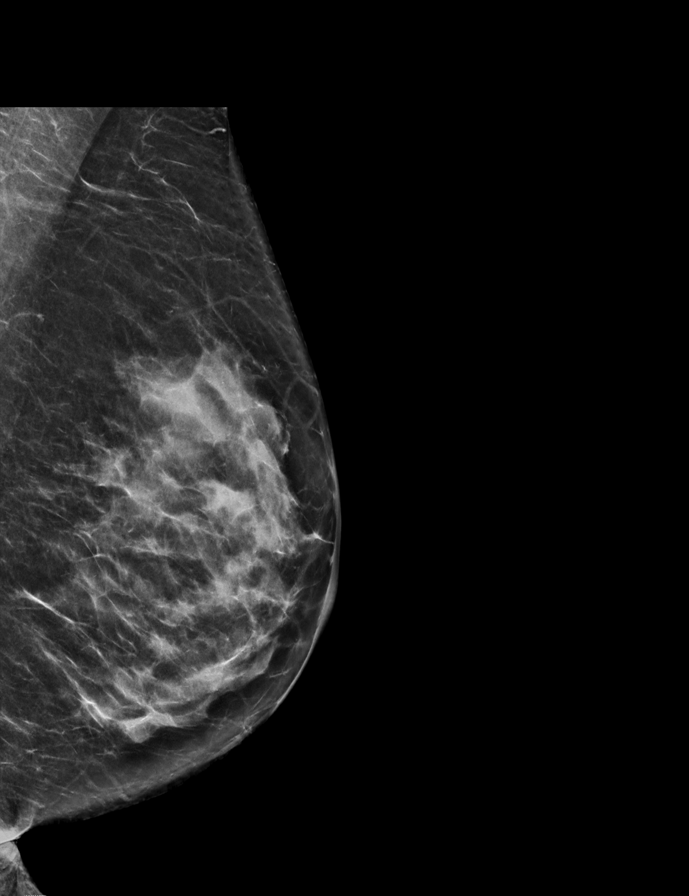

[R MLO synth-2D]
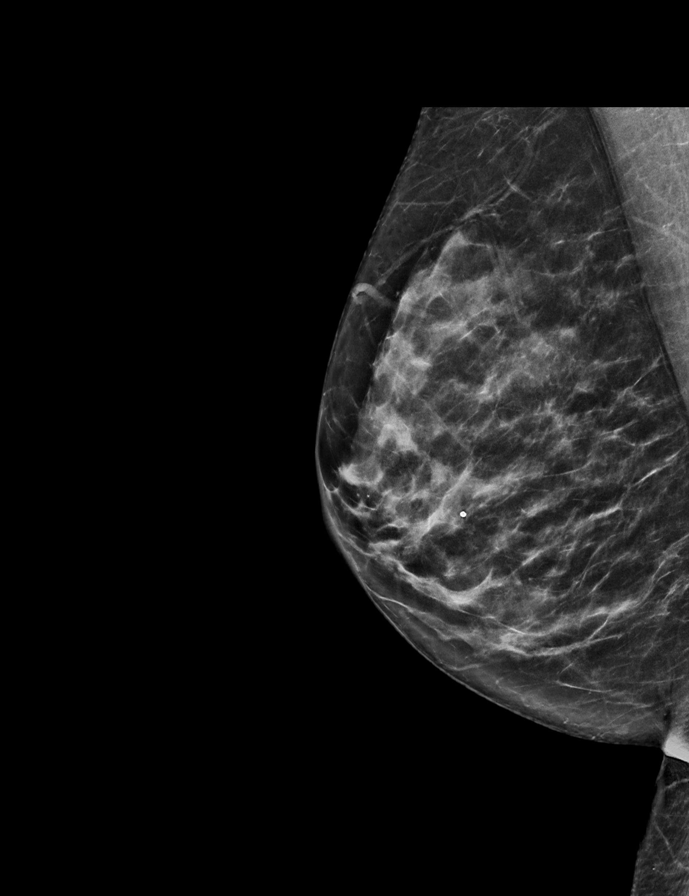

[L MLO tomo · 2 of 71 frames shown]
[frame 23/71]
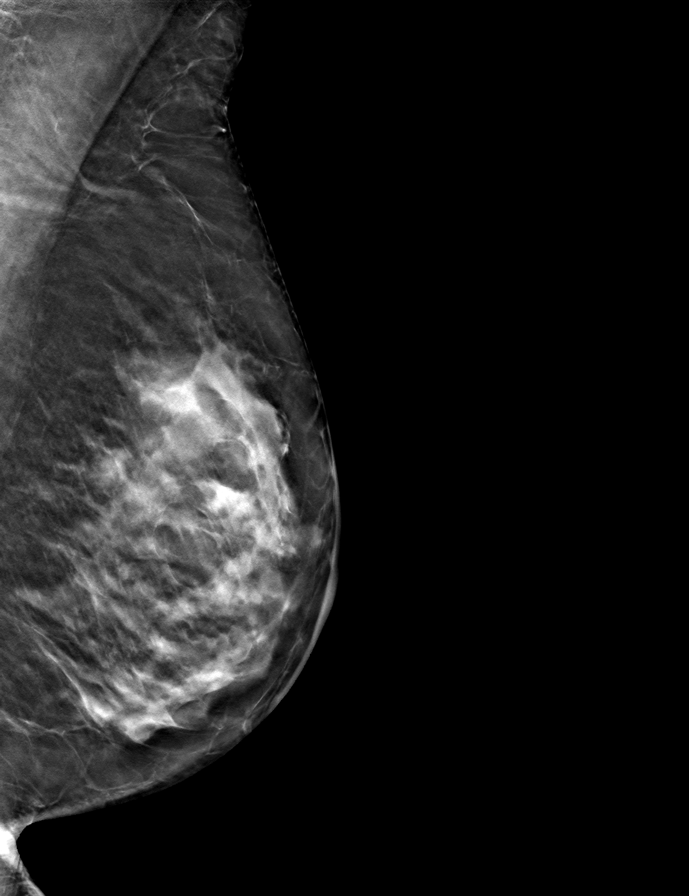
[frame 36/71]
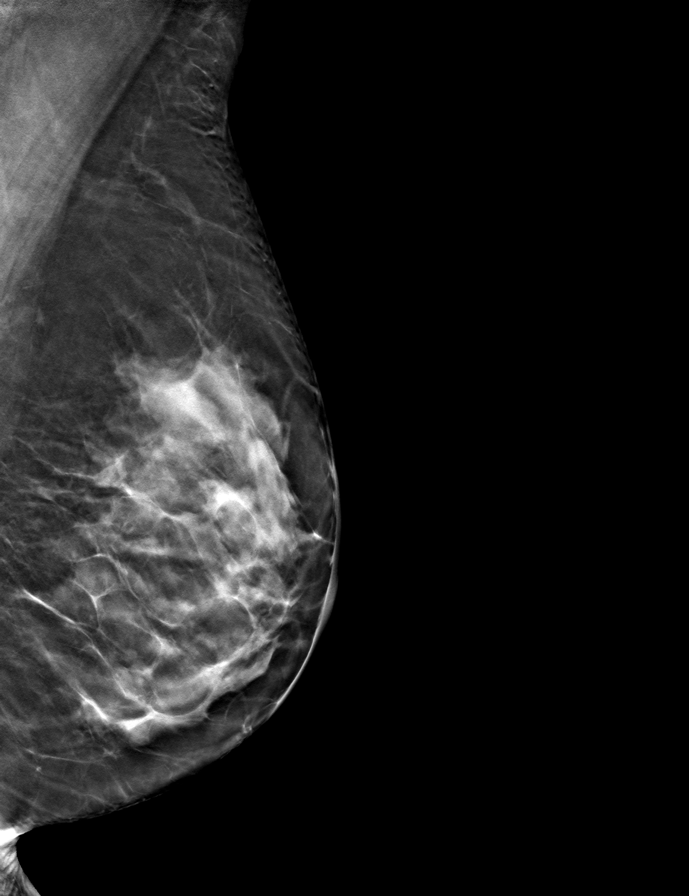

[R MLO tomo · tomo slice 35/70.0]
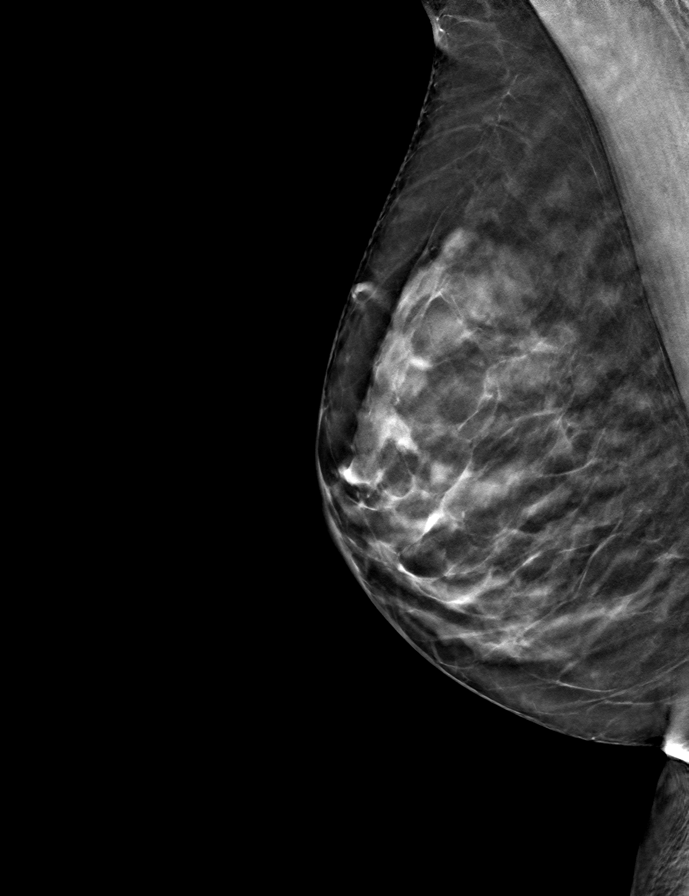

[R CC tomo · tomo slice 39/76.0]
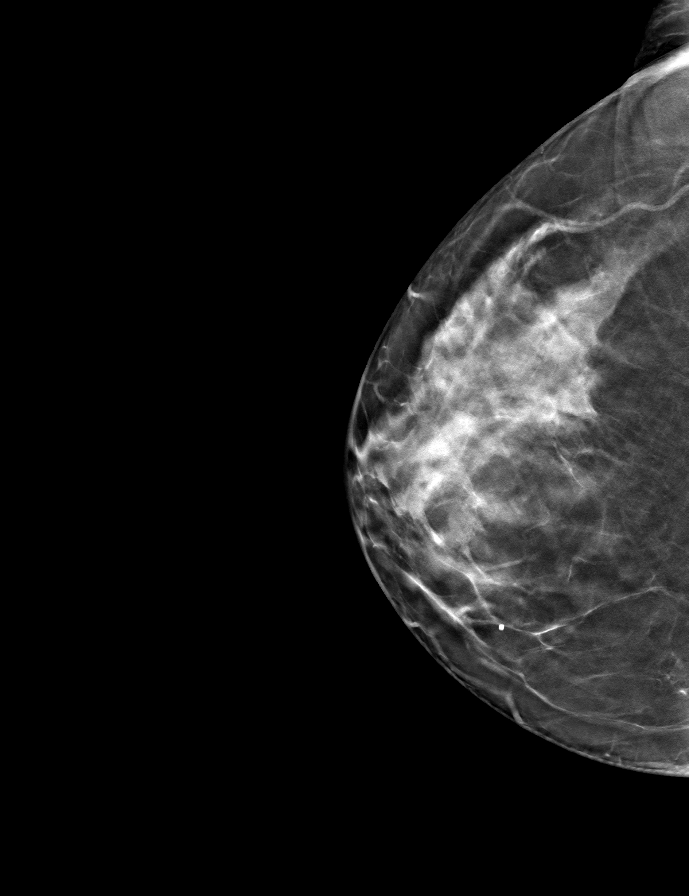

[L CC tomo · tomo slice 40/79.0]
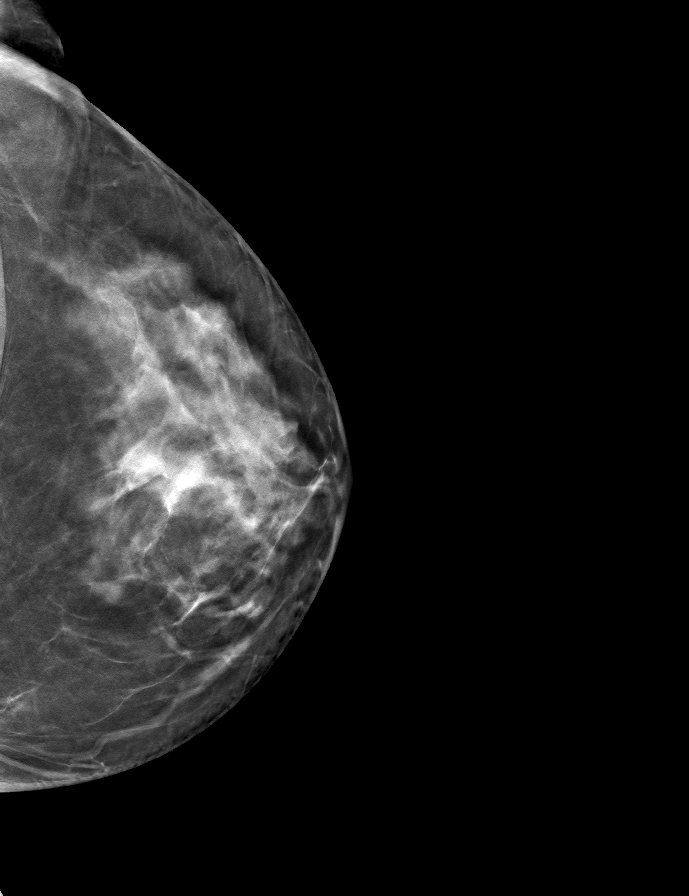

[9 of 24 positions shown; findings below may reference images not displayed]

ACR Breast Density Category c: The breast tissue is heterogeneously
dense, which may obscure small masses.
FINDINGS: There are no findings suspicious for malignancy.
IMPRESSION: No mammographic evidence of malignancy. A result letter of this
screening mammogram will be mailed directly to the patient.

RECOMMENDATION:
Screening mammogram in one year. (Code:[id number]-W-BC3)

BI-RADS CATEGORY  1: Negative.
